# Patient Record
Sex: Female | Born: 2000 | Race: White | Hispanic: No | Marital: Married | Smoking: Never smoker
Health system: Southern US, Community
[De-identification: ages and names within clinical notes are randomized; demographics above are authoritative.]

## PROBLEM LIST (undated history)

## (undated) DIAGNOSIS — D649 Anemia, unspecified: Secondary | ICD-10-CM

## (undated) HISTORY — PX: TONSILLECTOMY: SUR1361

## (undated) HISTORY — PX: ADENOIDECTOMY: SUR15

---
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
## 2023-01-16 IMAGING — CR DG CHEST 2V
2 series · 2 of 2 positions shown · non-contrast
Comparison: 10/04/2011

CLINICAL DATA: Cough

EXAM:
CHEST - 2 VIEW

[chest pa]
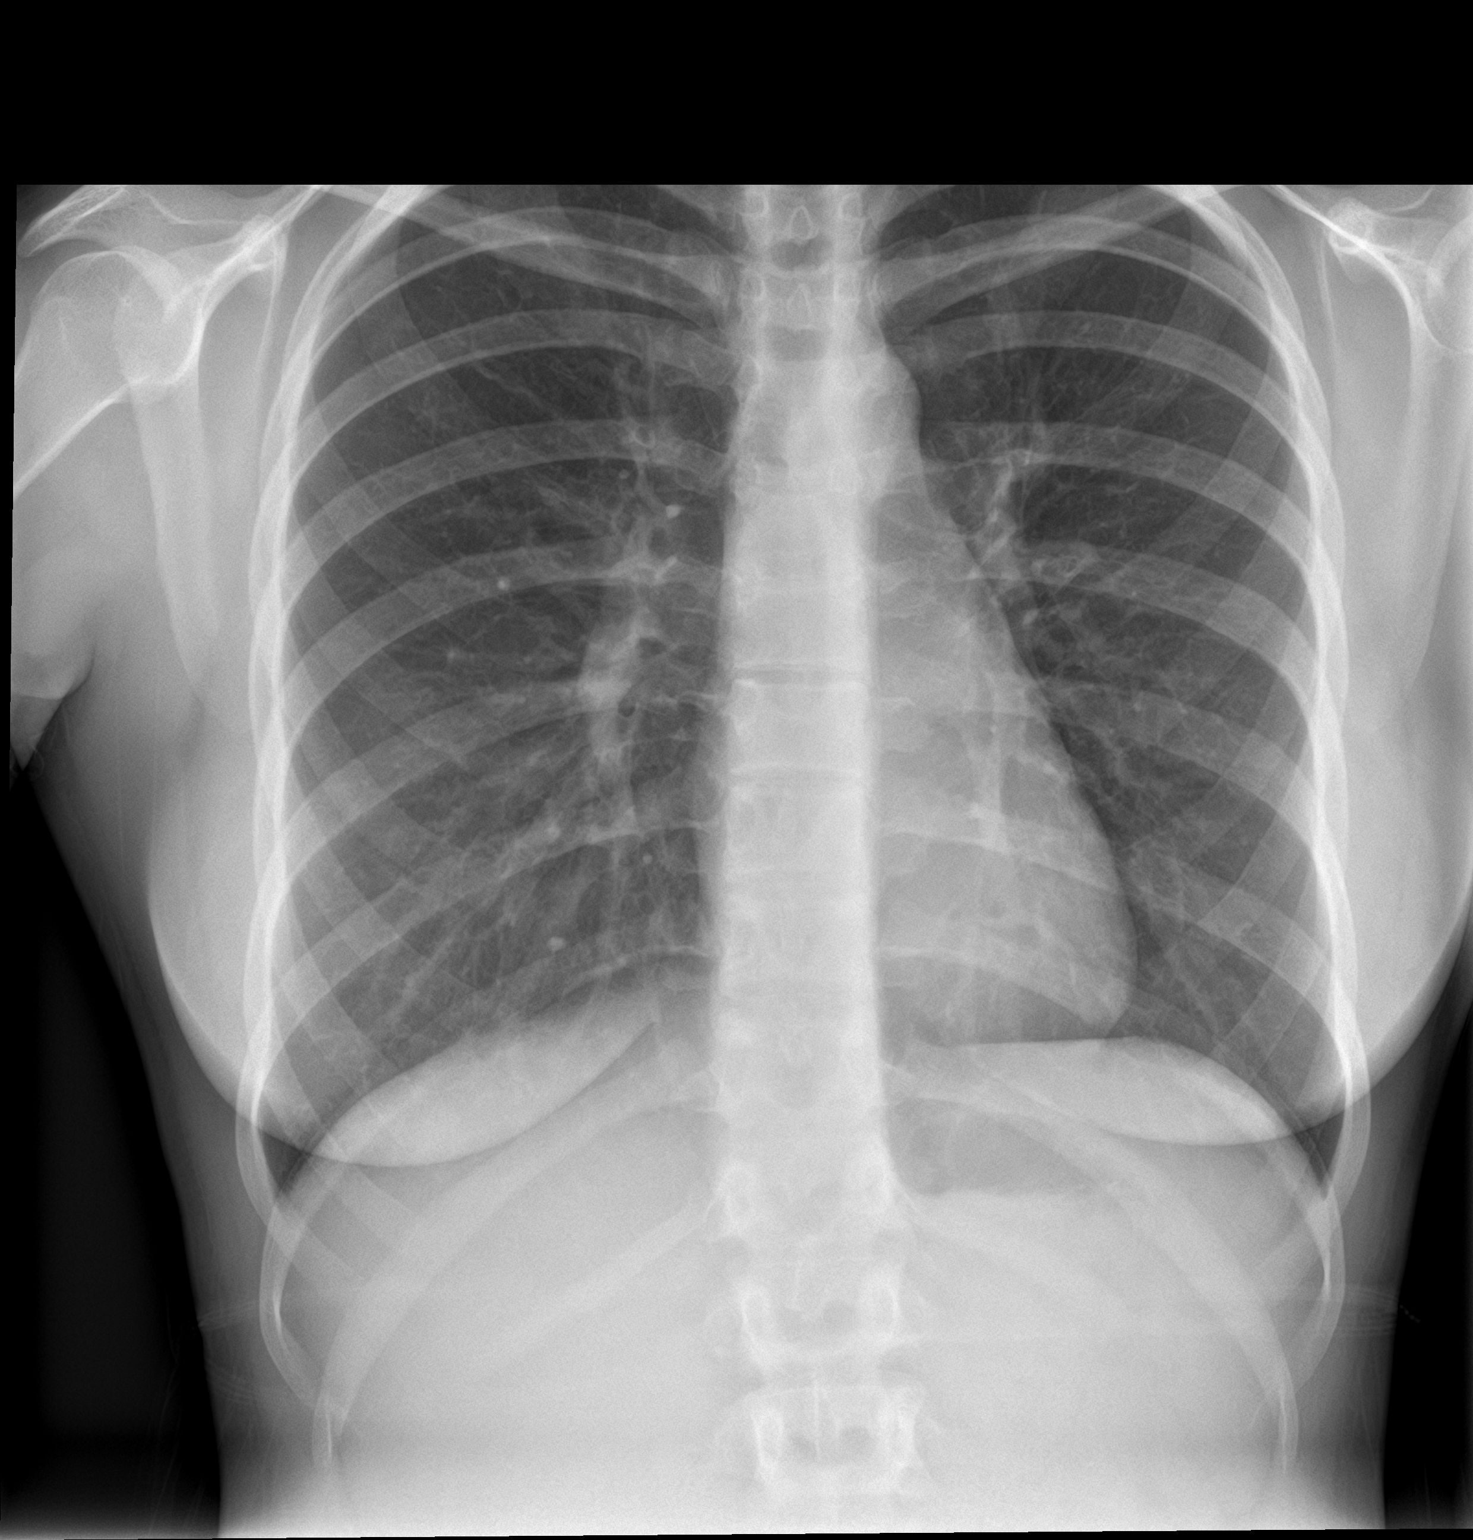

[chest lat]
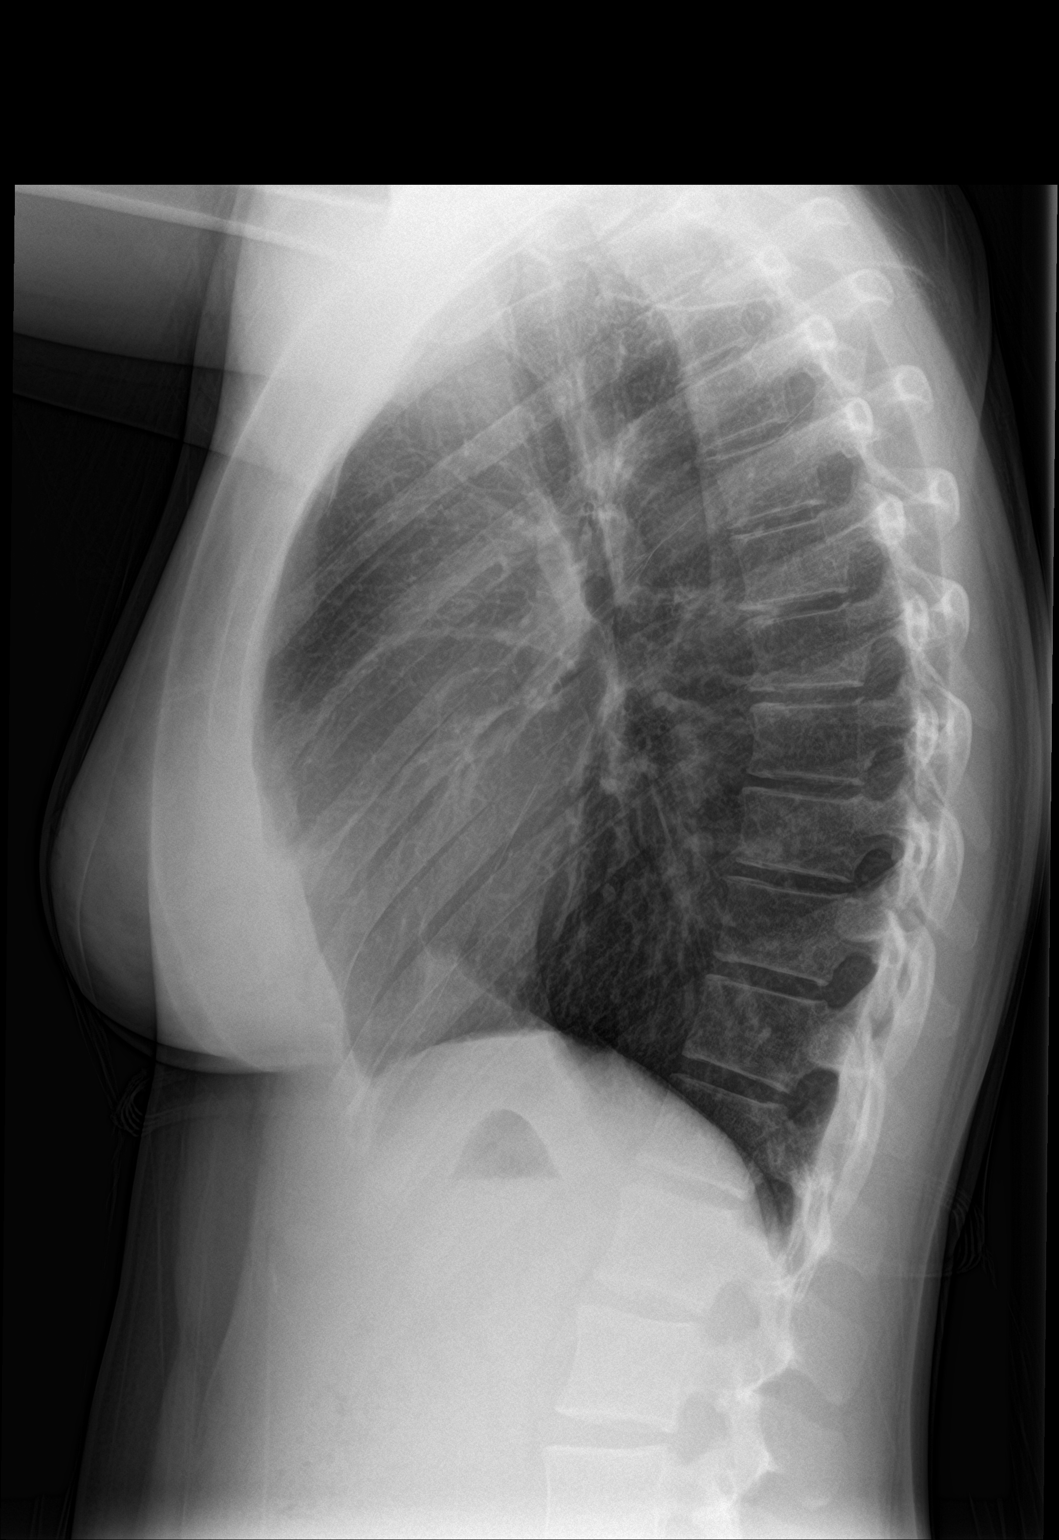

[2 of 2 positions shown; findings below may reference images not displayed]

FINDINGS: The heart size and mediastinal contours are within normal limits.
Both lungs are clear. No pleural effusion. The visualized skeletal
structures are unremarkable.
IMPRESSION: No acute process in the chest.

## 2024-08-26 ENCOUNTER — Ambulatory Visit: Admitting: Family Medicine

## 2024-12-02 ENCOUNTER — Emergency Department
Admission: EM | Admit: 2024-12-02 | Discharge: 2024-12-02 | Disposition: A | Discharge status: Home / Self Care | Attending: Emergency Medicine | Admitting: Emergency Medicine

## 2024-12-02 ENCOUNTER — Other Ambulatory Visit: Payer: Self-pay

## 2024-12-02 DIAGNOSIS — R220 Localized swelling, mass and lump, head: Secondary | ICD-10-CM | POA: Diagnosis present

## 2024-12-02 DIAGNOSIS — T7840XA Allergy, unspecified, initial encounter: Secondary | ICD-10-CM

## 2024-12-02 MED ORDER — DEXAMETHASONE SOD PHOSPHATE PF 10 MG/ML IJ SOLN
10.0000 mg | Freq: Once | INTRAMUSCULAR | Status: AC
  Administered 2024-12-02: 13:00:00 10 mg via INTRAMUSCULAR

## 2024-12-02 MED ORDER — PREDNISONE 10 MG (21) PO TBPK: ORAL_TABLET | ORAL | 0 refills | Status: AC

## 2024-12-02 MED ORDER — FAMOTIDINE 20 MG PO TABS
20.0000 mg | ORAL_TABLET | Freq: Once | ORAL | Status: AC
  Administered 2024-12-02: 13:00:00 20 mg via ORAL
  Filled 2024-12-02: qty 1

## 2024-12-02 MED ORDER — DIPHENHYDRAMINE HCL 25 MG PO CAPS
25.0000 mg | ORAL_CAPSULE | Freq: Once | ORAL | Status: AC
  Administered 2024-12-02: 13:00:00 25 mg via ORAL
  Filled 2024-12-02: qty 1

## 2024-12-02 MED ORDER — FAMOTIDINE 20 MG PO TABS: 20.0000 mg | ORAL_TABLET | Freq: Two times a day (BID) | ORAL | 0 refills | Status: AC

## 2024-12-02 NOTE — ED Provider Notes (Signed)
 [facility] Emergency Department Provider Note     Event Date/Time   First MD Initiated Contact with Patient 12/02/24 1151     (approximate)   History   Allergic Reaction   HPI  Haley Davidson is a 23 y.o. female with a past medical history of anemia resents to the ED with complaint of possible allergic reaction.  Patient reports she purchased a new silicone gauges for her ears and after wearing them noticed bilateral ear redness, dry skin and pruritus  x 1 week ago.  She noticed redness and increased swelling to her face yesterday with brought her to the ED today.  She denies oral swelling, tongue swelling or difficulty swallowing.  Denies chest pain or shortness of breath.  She has taken Benadryl  with some relief and itchiness.  No other complaint.     Physical Exam   Triage Vital Signs: ED Triage Vitals  Encounter Vitals Group     BP 12/02/24 1126 (!) 133/54     Girls Systolic BP Percentile --      Girls Diastolic BP Percentile --      Boys Systolic BP Percentile --      Boys Diastolic BP Percentile --      Pulse Rate 12/02/24 1126 78     Resp 12/02/24 1126 18     Temp 12/02/24 1126 97.9 F (36.6 C)     Temp src --      SpO2 12/02/24 1126 99 %     Weight 12/02/24 1125 130 lb (59 kg)     Height 12/02/24 1125 5' (1.524 m)     Head Circumference --      Peak Flow --      Pain Score 12/02/24 1125 0     Pain Loc --      Pain Education --      Exclude from Growth Chart --     Most recent vital signs: Vitals:   12/02/24 1126  BP: (!) 133/54  Pulse: 78  Resp: 18  Temp: 97.9 F (36.6 C)  SpO2: 99%    General Awake, no distress.  HEENT NCAT.  CV:  Good peripheral perfusion.  RESP:  Normal effort.  ABD:  No distention.  Soft, nontender Other:  Bilateral pedicles of ears reveals mild erythema and dry skin.  No rash.  No rash to chest, back or trunk.  Oropharynx is clear.  No oral swelling.  Uvula is midline.  Patient is able to speak in  complete sentences.  No obvious facial swelling noted.  ED Results / Procedures / Treatments   Labs (all labs ordered are listed, but only abnormal results are displayed) Labs Reviewed - No data to display  No results found.  PROCEDURES:  Critical Care performed: No  Procedures   MEDICATIONS ORDERED IN ED: Medications  dexamethasone  (DECADRON ) injection 10 mg (10 mg Intramuscular Given 12/02/24 1315)  diphenhydrAMINE  (BENADRYL ) capsule 25 mg (25 mg Oral Given 12/02/24 1315)  famotidine  (PEPCID ) tablet 20 mg (20 mg Oral Given 12/02/24 1315)     IMPRESSION / MDM / ASSESSMENT AND PLAN / ED COURSE  I reviewed the triage vital signs and the nursing notes.                               23 y.o. female presents to the emergency department for evaluation and treatment of possible allergic reaction. See HPI for further  details.   Differential diagnosis includes, but is not limited to allergic reaction, urticaria, contact dermatitis, anaphylactic reaction less likely.  Patient's presentation is most consistent with acute complicated illness / injury requiring diagnostic workup.  Patient is alert and oriented.  Physical exam findings are stated above.  No indications or signs of anaphylactic reaction.  Will treat with Benadryl , Decadron  and famotidine .  Will continue outpatient management with steroids and famotidine .  Patient stable condition for discharge home and outpatient management.  ED return precautions are discussed.  FINAL CLINICAL IMPRESSION(S) / ED DIAGNOSES   Final diagnoses:  Allergic reaction, initial encounter   Rx / DC Orders   ED Discharge Orders          Ordered    predniSONE  (STERAPRED UNI-PAK 21 TAB) 10 MG (21) TBPK tablet        12/02/24 1354    famotidine  (PEPCID ) 20 MG tablet  2 times daily        12/02/24 1354             Note:  This document was prepared using Dragon voice recognition software and may include unintentional dictation  errors.    Margrette, Cedarius Kersh A, PA-C 12/02/24 1427    Floy Roberts, MD 12/02/24 [phone]

## 2024-12-02 NOTE — ED Triage Notes (Signed)
 Pt comes with c/o allergic reaction. Pt states her ears itchy for few days and bumpy. Pt states this morning she woke up and her face was red. Pt did take benadryl  last night. Pt speaking in clear and complete sentences

## 2024-12-06 ENCOUNTER — Other Ambulatory Visit: Payer: Self-pay

## 2024-12-06 ENCOUNTER — Ambulatory Visit: Admitting: Internal Medicine

## 2024-12-06 ENCOUNTER — Encounter: Payer: Self-pay | Admitting: Internal Medicine

## 2024-12-06 VITALS — BP 118/76 | HR 80 | Temp 98.3°F | Ht 60.0 in | Wt 138.6 lb

## 2024-12-06 DIAGNOSIS — L500 Allergic urticaria: Secondary | ICD-10-CM | POA: Diagnosis not present

## 2024-12-06 MED ORDER — CETIRIZINE HCL 10 MG PO TABS: 10.0000 mg | ORAL_TABLET | Freq: Two times a day (BID) | ORAL | 5 refills | Status: AC | PRN

## 2024-12-06 NOTE — Patient Instructions (Addendum)
 Urticaria (Hives): - At this time etiology of hives and swelling is unknown. Hives can be caused by a variety of different triggers including illness/infection, pressure, vibrations, extremes of temperature to name a few however majority of the time there is no identifiable trigger.  -If hives recur, start Zyrtec 10mg  twice daily as needed.     Follow up: 1/2 at 830 for skin testing 1-68 Hold all anti-histamines (Xyzal, Allegra, Zyrtec, Claritin, Benadryl , Pepcid ) 3 days prior to next visit.

## 2024-12-06 NOTE — Progress Notes (Signed)
 "  NEW PATIENT  Date of Service/Encounter:  12/06/2024  Consult requested by: [facility]   Subjective:   Haley Davidson (DOB: 20-May-2001) is a 23 y.o. female who presents to the clinic on 12/06/2024 with a chief complaint of Allergic Reaction (Patient states that she went to [employer] to eat and woke the next morning to hives and facial swelling. Went to the ED and they prescribed Benadryl , prednisone , and a prednisone  injection.) .    History obtained from: chart review and patient.   Allergic Reaction:  Notes on Sunday night 12/14, she ate [employer]- chicken pasta which she has done before.  Next AM developed facial swelling mostly around eyes and itchy, welt like rash on neck/arms Went to ED and told she had hives and told to take prednisone  and benadryl . No scarring/pain.   No hx of hives/swelling.  No illness  No new medications. Only on birth control- nexplanon. No new products but she had new ear silicone gauges.    Reviewed:  12/02/2024: seen in ED for redness, dry skin, pruritus after wearing new silicone gauges. Noted to have mild erythema and dry skin on bl ear pedicles on exam.  Given decadron , benadryl , pepcid ; d/c home with prednisone .   Past Medical History: Past Medical History:  Diagnosis Date   Anemia    from heavy periods   Angio-edema    Urticaria    Past Surgical History: Past Surgical History:  Procedure Laterality Date   ADENOIDECTOMY     TONSILLECTOMY      Family History: Family History  Problem Relation Age of Onset   Allergic rhinitis Mother    Diabetes type II Father     Social History:  Flooring in bedroom: carpet Pets: dog Tobacco use/exposure: none Job: [employer]   Medication List:  Allergies as of 12/06/2024   No Known Allergies      Medication List        Accurate as of December 06, 2024  2:26 PM. If you have any questions, ask your nurse or doctor.          famotidine  20 MG tablet Commonly  known as: PEPCID  Take 1 tablet (20 mg total) by mouth 2 (two) times daily.   Nexplanon 68 MG Impl implant Generic drug: etonogestrel 1 each by Subdermal route once.   predniSONE  10 MG (21) Tbpk tablet Commonly known as: STERAPRED UNI-PAK 21 TAB Take 6 pills on day 1 then decrease by 1 pill each day.         REVIEW OF SYSTEMS: Pertinent positives and negatives discussed in HPI.   Objective:   Physical Exam: BP 118/76 (BP Location: Right Arm, Patient Position: Sitting, Cuff Size: Normal)   Pulse 80   Temp 98.3 F (36.8 C) (Temporal)   Ht 5' (1.524 m)   Wt 138 lb 9.6 oz (62.9 kg)   LMP 12/02/2024   SpO2 99%   BMI 27.07 kg/m  Body mass index is 27.07 kg/m. GEN: alert, well developed HEENT: clear conjunctiva, nose without inferior turbinate hypertrophy, pink nasal mucosa, slight clear rhinorrhea, no cobblestoning HEART: regular rate and rhythm, no murmur LUNGS: clear to auscultation bilaterally, no coughing, unlabored respiration ABDOMEN: soft, non distended  SKIN: no rashes or lesions  Assessment:   1. Allergic urticaria     Plan/Recommendations:  Urticaria (Hives): - At this time etiology of hives and swelling is unknown. Hives can be caused by a variety of different triggers including illness/infection, pressure, vibrations, extremes of  temperature to name a few however majority of the time there is no identifiable trigger.  -If hives recur, start Zyrtec  10mg  twice daily as needed.     Follow up: 1/2 at 830 for skin testing 1-68, no IDs Hold all anti-histamines (Xyzal, Allegra, Zyrtec , Claritin, Benadryl , Pepcid ) 3 days prior to next visit.    Arleta Blanch, MD [facility]        "

## 2024-12-20 ENCOUNTER — Ambulatory Visit: Admitting: Internal Medicine

## 2024-12-20 DIAGNOSIS — L500 Allergic urticaria: Secondary | ICD-10-CM | POA: Diagnosis not present

## 2024-12-20 NOTE — Patient Instructions (Addendum)
 Urticaria (Hives): - At this time etiology of hives and swelling is unknown. Hives can be caused by a variety of different triggers including illness/infection, pressure, vibrations, extremes of temperature to name a few however majority of the time there is no identifiable trigger.  - SPT 12/2024: negative to aeroallergens and commonly allergenic foods  -If hives/swelling return, start Zyrtec  10mg  twice daily.   -If no improvement in 2-3 days, add Pepcid  20mg  twice daily and continue Zyrtec  10mg  twice daily.

## 2024-12-20 NOTE — Progress Notes (Signed)
 "  FOLLOW UP Date of Service/Encounter:  12/20/2024   Subjective:  Haley Davidson (DOB: 01/25/2001) is a 24 y.o. female who [facility] for follow up for skin testing.   History obtained from: chart review and patient.  Anti histamines held.   Past Medical History: Past Medical History:  Diagnosis Date   Anemia    from heavy periods   Angio-edema    Urticaria     Objective:  LMP 12/02/2024  There is no height or weight on file to calculate BMI. Physical Exam: GEN: alert, well developed HEENT: clear conjunctiva, MMM LUNGS: unlabored respiration   Skin Testing:  Skin prick testing was placed, which includes aeroallergens/foods, histamine control, and saline control.  Verbal consent was obtained prior to placing test.  Patient tolerated procedure well.  Allergy testing results were read and interpreted by myself, documented by clinical staff. Adequate positive and negative control.  Positive results to:  Results discussed with patient/family.  Airborne Adult Perc - 12/20/24 0848     Time Antigen Placed 0848    Allergen Manufacturer Jestine    Location Back    Number of Test 55    2. Control-Histamine 3+    3. Bahia Negative    4. Bermuda Negative    5. Johnson Negative    6. [place]  Blue Negative    7. Meadow Fescue Negative    8. Perennial Rye Negative    9. Timothy Negative    10. Ragweed Mix Negative    11. Cocklebur Negative    12. Plantain,  English Negative    13. Baccharis Negative    14. Dog Fennel Negative    15. Russian Thistle Negative    16. Lamb's Quarters Negative    17. Sheep Sorrell Negative    18. Rough Pigweed Negative    19. Marsh Elder, Rough Negative    20. Mugwort, Common Negative    21. Box, Elder Negative    22. Cedar, red Negative    23. Sweet Gum Negative    24. Pecan Pollen Negative    25. Pine Mix Negative    26. Walnut, Black Pollen Negative    27. Red Mulberry Negative    28. Ash Mix  Negative    29. Birch Mix Negative    30. Beech American Negative    31. Cottonwood, Eastern Negative    32. Hickory, White Negative    33. Maple Mix Negative    34. Oak, Eastern Mix Negative    35. Sycamore Eastern Negative    36. Alternaria Alternata Negative    37. Cladosporium Herbarum Negative    38. Aspergillus Mix Negative    39. Penicillium Mix Negative    40. Bipolaris Sorokiniana (Helminthosporium) Negative    41. Drechslera Spicifera (Curvularia) Negative    42. Mucor Plumbeus Negative    43. Fusarium Moniliforme Negative    44. Aureobasidium Pullulans (pullulara) Negative    45. Rhizopus Oryzae Negative    46. Botrytis Cinera Negative    47. Epicoccum Nigrum Negative    48. Phoma Betae Negative    49. Dust Mite Mix Negative    50. Cat Hair 10,000 BAU/ml Negative    51.  Dog Epithelia Negative    52. Mixed Feathers Negative    53. Horse Epithelia Negative    54. Cockroach, German Negative    55. Tobacco Leaf Negative          Food Adult Perc -  12/20/24 0800     Time Antigen Placed 0848    Allergen Manufacturer Jestine    Location Back    Number of allergen test 13    1. Peanut Negative    2. Soybean Negative    3. Wheat Negative    4. Sesame Negative    5. Milk, Cow Negative    6. Casein Negative    7. Egg White, Chicken Negative    8. Shellfish Mix Negative    9. Fish Mix Negative    10. Cashew Negative    11. Walnut Food Negative    12. Almond Negative    13. Hazelnut Negative           Assessment:   1. Allergic urticaria     Plan/Recommendations:  Urticaria (Hives): - At this time etiology of hives and swelling is unknown. Hives can be caused by a variety of different triggers including illness/infection, pressure, vibrations, extremes of temperature to name a few however majority of the time there is no identifiable trigger.  - SPT 12/2024: negative to aeroallergens and commonly allergenic foods  -If hives/swelling return, start Zyrtec   10mg  twice daily.   -If no improvement in 2-3 days, add Pepcid  20mg  twice daily and continue Zyrtec  10mg  twice daily.     Return in about 4 weeks (around 01/17/2025).  Arleta Blanch, MD [facility]       "

## 2025-01-17 ENCOUNTER — Ambulatory Visit: Admitting: Internal Medicine
